# Patient Record
Sex: Female | Born: 1975 | Race: White | Hispanic: No | Marital: Married | Smoking: Current every day smoker
Health system: Southern US, Community
[De-identification: ages and names within clinical notes are randomized; demographics above are authoritative.]

## PROBLEM LIST (undated history)

## (undated) DIAGNOSIS — F419 Anxiety disorder, unspecified: Secondary | ICD-10-CM

---

## 2007-08-22 ENCOUNTER — Ambulatory Visit: Payer: Self-pay | Admitting: Internal Medicine

## 2008-01-02 ENCOUNTER — Ambulatory Visit: Payer: Self-pay | Admitting: Emergency Medicine

## 2008-01-27 ENCOUNTER — Ambulatory Visit: Payer: Self-pay | Admitting: Family Medicine

## 2008-03-21 ENCOUNTER — Ambulatory Visit: Payer: Self-pay | Admitting: Family Medicine

## 2008-05-20 ENCOUNTER — Ambulatory Visit: Payer: Self-pay | Admitting: Family Medicine

## 2008-07-09 ENCOUNTER — Ambulatory Visit: Payer: Self-pay | Admitting: Family Medicine

## 2009-02-26 ENCOUNTER — Ambulatory Visit: Payer: Self-pay | Admitting: Internal Medicine

## 2009-05-19 ENCOUNTER — Ambulatory Visit: Payer: Self-pay | Admitting: Internal Medicine

## 2009-06-27 ENCOUNTER — Ambulatory Visit: Payer: Self-pay | Admitting: Internal Medicine

## 2012-11-08 ENCOUNTER — Ambulatory Visit: Payer: Self-pay

## 2012-11-21 ENCOUNTER — Ambulatory Visit: Payer: Self-pay | Admitting: Family Medicine

## 2013-05-08 ENCOUNTER — Ambulatory Visit: Payer: Self-pay | Admitting: Family Medicine

## 2017-08-23 ENCOUNTER — Other Ambulatory Visit: Payer: Self-pay

## 2017-08-23 ENCOUNTER — Ambulatory Visit
Admission: EM | Admit: 2017-08-23 | Discharge: 2017-08-23 | Disposition: A | Discharge status: Home / Self Care | Payer: BLUE CROSS/BLUE SHIELD

## 2017-08-23 ENCOUNTER — Encounter: Payer: Self-pay | Admitting: Emergency Medicine

## 2017-08-23 DIAGNOSIS — R519 Headache, unspecified: Secondary | ICD-10-CM

## 2017-08-23 DIAGNOSIS — R51 Headache: Secondary | ICD-10-CM | POA: Diagnosis not present

## 2017-08-23 MED ORDER — SUMATRIPTAN SUCCINATE 6 MG/0.5ML ~~LOC~~ SOLN
6.0000 mg | Freq: Once | SUBCUTANEOUS | Status: AC
  Administered 2017-08-23: 6 mg via SUBCUTANEOUS

## 2017-08-23 NOTE — Discharge Instructions (Signed)
TRest. Drink plenty of fluids.   Follow up with your primary care physician. Return to Urgent care or Emergency room for new or worsening concerns.

## 2017-08-23 NOTE — ED Provider Notes (Signed)
[facility] ____________________________________________  Time seen: Approximately 8:56 AM  I have reviewed the triage vital signs and the nursing notes.   HISTORY  Chief Complaint Headache   HPI Kristen Huynh is a 42 y.o. female presenting for evaluation of headache.  Patient describes her headache as a frontal headache, stating she feels like it is behind both of her eyes.  Patient states that this is similar to previous headaches.  States current headache is described as throbbing and has been present since Saturday, but has intermittently improved and then increase in severity.  Unrelieved with over-the-counter Tylenol and ibuprofen. Reports chronic headaches and previously told that she had cluster headaches.  States never previously evaluated by neurologist.  States headaches vary in intensity, frequency and length of time.  Patient again states this is same as previous headaches.  Reports some intermittent vision changes described as hard to focus.  Denies vision loss.  States occasional accompanying nausea, denies current nausea.  Denies fall or head injury.  States current pain is moderate.  No recent sickness, cough, fevers, sore throat.  Denies facial swelling, loss of sensation, paresthesias, unilateral weakness, syncope or near syncope.  Reports continues to eat and drink well. Denies chest pain, shortness of breath, abdominal pain, dysuria, extremity pain, extremity swelling or rash. Denies recent sickness. Denies recent antibiotic use.  patient reports in the past Tylenol No. 3  And tramadol worked well for her headaches, but antimigraine medicine did not work as well.  No LMP recorded. Patient is not currently having periods (Reason: IUD).  Last menstrual this week.  Denies pregnancy.  History reviewed. No pertinent past medical history.  There are no active problems to display for this patient.   Past Surgical History:  Procedure Laterality Date    . CESAREAN SECTION       No current facility-administered medications for this encounter.   Current Outpatient Medications:  .  escitalopram (LEXAPRO) 20 MG tablet, Take 20 mg by mouth daily., Disp: , Rfl:  .  levonorgestrel (MIRENA) 20 MCG/24HR IUD, 1 each by Intrauterine route once., Disp: , Rfl:  .  LORazepam (ATIVAN) 1 MG tablet, Take 1 mg by mouth every 4 (four) hours as needed for anxiety., Disp: , Rfl:   Allergies Patient has no known allergies.  History reviewed. No pertinent family history.  Social History Social History   Tobacco Use  . Smoking status: Current Every Day Smoker    Types: Cigarettes  . Smokeless tobacco: Never Used  Substance Use Topics  . Alcohol use: No    Frequency: Never  . Drug use: Not on file    Review of Systems Constitutional: No fever/chills ENT: No sore throat. Cardiovascular: Denies chest pain. Respiratory: Denies shortness of breath. Gastrointestinal: No abdominal pain.   Musculoskeletal: Negative for back pain. Skin: Negative for rash. Neurological: Negative for  focal weakness or numbness.   ____________________________________________   PHYSICAL EXAM:  VITAL SIGNS: ED Triage Vitals  Enc Vitals Group     BP 08/23/17 0839 122/74     Pulse Rate 08/23/17 0839 (!) 103     Resp 08/23/17 0839 16     Temp 08/23/17 0839 98.6 F (37 C)     Temp Source 08/23/17 0839 Oral     SpO2 08/23/17 0839 100 %     Weight 08/23/17 0835 194 lb (88 kg)     Height 08/23/17 0835 5\' 4"  (1.626 m)     Head Circumference --  Peak Flow --      Pain Score 08/23/17 0835 10     Pain Loc --      Pain Edu? --      Excl. in GC? --    Constitutional: Alert and oriented. Well appearing and in no acute distress. Eyes: Conjunctivae are normal. PERRL. EOMI.  Head: Atraumatic. No tenderness over temporal arteries. No sinus TTP. No tenderness to palpation.   Ears: no erythema, normal TMs bilaterally.   Nose: No  congestion/rhinnorhea.  Mouth/Throat: Mucous membranes are moist.  Oropharynx non-erythematous. Neck: No stridor.  No cervical spine tenderness to palpation.  Hematological/Lymphatic/Immunilogical: No cervical lymphadenopathy. Cardiovascular: Normal rate, regular rhythm. Grossly normal heart sounds.  Good peripheral circulation. Respiratory: Normal respiratory effort.  No retractions.  No wheezes, rales or rhonchi. Musculoskeletal: No lower or upper extremity tenderness nor edema.   No cervical, thoracic or lumbar TTP.  Neurologic:  Normal speech and language. No gross focal neurologic deficits are appreciated. No gait instability. No ataxia.Negative Romberg.No meningismus.   Skin:  Skin is warm, dry and intact. No rash noted. Psychiatric: Mood and affect are normal. Speech and behavior are normal. ___________________________________________   LABS (all labs ordered are listed, but only abnormal results are displayed)  Labs Reviewed - No data to display ____________________________________________   PROCEDURES Procedures   INITIAL IMPRESSION / ASSESSMENT AND PLAN / ED COURSE  Pertinent labs & imaging results that were available during my care of the patient were reviewed by me and considered in my medical decision making (see chart for details).  Well-appearing patient.  Patient with acute on chronic headache.  Reports history of cluster headaches, and not previously evaluated by neurology.  Recommend patient to continue to work to establish primary care, reporting that she has an appointment in February.  Encouraged neurology evaluation.  6 mg Imitrex subcu given once in urgent care with some improvement.  Encourage patient to follow-up as mentioned.  Encourage rest, fluids, supportive care.  Declines nausea medicine.  Discussed strict follow-up and return parameters including proceed to the emergency room for any worsening concerns.   Discussed follow up and return parameters including  no resolution or any worsening concerns. Patient verbalized understanding and agreed to plan.   ____________________________________________   FINAL CLINICAL IMPRESSION(S) / ED DIAGNOSES  Final diagnoses:  Acute nonintractable headache, unspecified headache type     ED Discharge Orders    None       Note: This dictation was prepared with Dragon dictation along with smaller phrase technology. Any transcriptional errors that result from this process are unintentional.         Renford DillsMiller, Sally-Ann Cutbirth, NP 08/23/17 1110

## 2017-08-23 NOTE — ED Triage Notes (Signed)
Patient c/o HAs that started on Saturday.  Patient states that she has been told she has cluster headaches.  Patient in the process of finding a PCP.

## 2017-08-26 ENCOUNTER — Telehealth: Payer: Self-pay

## 2017-08-26 NOTE — Telephone Encounter (Signed)
I called and left a VMM with our return contact information

## 2018-09-02 ENCOUNTER — Ambulatory Visit
Admission: EM | Admit: 2018-09-02 | Discharge: 2018-09-02 | Disposition: A | Discharge status: Home / Self Care | Payer: BLUE CROSS/BLUE SHIELD | Attending: Family Medicine | Admitting: Family Medicine

## 2018-09-02 ENCOUNTER — Encounter: Payer: Self-pay | Admitting: Gynecology

## 2018-09-02 ENCOUNTER — Other Ambulatory Visit: Payer: Self-pay

## 2018-09-02 DIAGNOSIS — B9689 Other specified bacterial agents as the cause of diseases classified elsewhere: Secondary | ICD-10-CM | POA: Diagnosis not present

## 2018-09-02 DIAGNOSIS — N760 Acute vaginitis: Secondary | ICD-10-CM | POA: Diagnosis not present

## 2018-09-02 HISTORY — DX: Anxiety disorder, unspecified: F41.9

## 2018-09-02 LAB — WET PREP, GENITAL
Sperm: NONE SEEN
Trich, Wet Prep: NONE SEEN
Yeast Wet Prep HPF POC: NONE SEEN

## 2018-09-02 LAB — URINALYSIS, COMPLETE (UACMP) WITH MICROSCOPIC
Bilirubin Urine: NEGATIVE
GLUCOSE, UA: NEGATIVE mg/dL
Ketones, ur: NEGATIVE mg/dL
Nitrite: NEGATIVE
Protein, ur: NEGATIVE mg/dL
Specific Gravity, Urine: 1.02 (ref 1.005–1.030)
pH: 7.5 (ref 5.0–8.0)

## 2018-09-02 MED ORDER — METRONIDAZOLE 500 MG PO TABS: 500.0000 mg | ORAL_TABLET | Freq: Two times a day (BID) | ORAL | 0 refills | Status: AC

## 2018-09-02 MED ORDER — FLUCONAZOLE 150 MG PO TABS: 150.0000 mg | ORAL_TABLET | Freq: Once | ORAL | 0 refills | Status: AC

## 2018-09-02 NOTE — ED Triage Notes (Signed)
Per patient with yeast infection x 2 weeks. Per patient  Vaginal itching and burning

## 2018-09-02 NOTE — ED Provider Notes (Signed)
[facility]    CSN: [id number] Arrival date & time: 09/02/18  0803     History   Chief Complaint Chief Complaint  Patient presents with  . Vaginitis    HPI Kristen Huynh is a 43 y.o. female.   43 year old female presents with vaginal itching and irritation for the past 2 weeks. Started with slight irritation and has gotten worse in the past week. Denies any fever, dysuria, nausea or vomiting. Also denies any abdominal pain or back pain. Has tried Vagisil with some relief. Currently has Mirena IUD in place and occasionally spots. Noticed slight brown discharge today. Is sexually active with husband. Last yeast infection many years ago. Has a GYN and was planning to see them tomorrow but irritation and itching got so much worse today that she needed to be seen. Other chronic health issues includes anxiety and currently on Lexapro daily.   The history is provided by the patient.    Past Medical History:  Diagnosis Date  . Anxiety     There are no active problems to display for this patient.   Past Surgical History:  Procedure Laterality Date  . CESAREAN SECTION      OB History   No obstetric history on file.      Home Medications    Prior to Admission medications   Medication Sig Start Date End Date Taking? Authorizing Provider  escitalopram (LEXAPRO) 20 MG tablet Take 20 mg by mouth daily.   Yes [provider]  levonorgestrel (MIRENA) 20 MCG/24HR IUD 1 each by Intrauterine route once.   Yes [provider]  metroNIDAZOLE (FLAGYL) 500 MG tablet Take 1 tablet (500 mg total) by mouth 2 (two) times daily for 7 days. 09/02/18 09/09/18  Sudie GrumblingAmyot, Tank Difiore Berry, NP    Family History Family History  Problem Relation Age of Onset  . Heart disease Father     Social History Social History   Tobacco Use  . Smoking status: Current Every Day Smoker    Types: Cigarettes  . Smokeless tobacco: Never Used  Substance Use Topics  . Alcohol  use: No    Frequency: Never  . Drug use: Never     Allergies   Patient has no known allergies.   Review of Systems Review of Systems  Constitutional: Negative for activity change, appetite change, chills, fatigue and fever.  HENT: Negative for mouth sores, postnasal drip, sore throat and trouble swallowing.   Respiratory: Negative for cough, chest tightness, shortness of breath and wheezing.   Gastrointestinal: Negative for abdominal pain, constipation, diarrhea, nausea and vomiting.  Genitourinary: Positive for vaginal bleeding (slight spotting), vaginal discharge and vaginal pain (irritation and itching). Negative for decreased urine volume, difficulty urinating, dysuria, flank pain, frequency, genital sores, hematuria, pelvic pain and urgency.  Musculoskeletal: Negative for arthralgias, back pain and myalgias.  Skin: Negative for color change, rash and wound.  Allergic/Immunologic: Negative for immunocompromised state.  Neurological: Negative for dizziness, seizures, syncope, weakness, light-headedness and headaches.  Hematological: Negative for adenopathy. Does not bruise/bleed easily.     Physical Exam Triage Vital Signs ED Triage Vitals  Enc Vitals Group     BP 09/02/18 0814 (!) 122/91     Pulse Rate 09/02/18 0814 93     Resp 09/02/18 0814 16     Temp 09/02/18 0814 98 F (36.7 C)     Temp Source 09/02/18 0814 Oral     SpO2 09/02/18 0814 100 %  Weight 09/02/18 0816 186 lb (84.4 kg)     Height 09/02/18 0816 5\' 1"  (1.549 m)     Head Circumference --      Peak Flow --      Pain Score 09/02/18 0816 2     Pain Loc --      Pain Edu? --      Excl. in GC? --    No data found.  Updated Vital Signs BP (!) 122/91 (BP Location: Left Arm)   Pulse 93   Temp 98 F (36.7 C) (Oral)   Resp 16   Ht 5\' 1"  (1.549 m)   Wt 186 lb (84.4 kg)   SpO2 100%   BMI 35.14 kg/m   Visual Acuity Right Eye Distance:   Left Eye Distance:   Bilateral Distance:    Right Eye Near:     Left Eye Near:    Bilateral Near:     Physical Exam Vitals signs and nursing note reviewed.  Constitutional:      General: She is awake. She is not in acute distress.    Appearance: Normal appearance. She is well-developed, well-groomed and overweight. She is not ill-appearing.     Comments: Patient sitting comfortably on exam table in no acute distress.   HENT:     Head: Normocephalic and atraumatic.     Right Ear: External ear normal.     Left Ear: External ear normal.  Eyes:     Extraocular Movements: Extraocular movements intact.     Conjunctiva/sclera: Conjunctivae normal.  Neck:     Musculoskeletal: Normal range of motion.  Cardiovascular:     Rate and Rhythm: Normal rate.  Pulmonary:     Effort: Pulmonary effort is normal.  Abdominal:     General: Abdomen is flat. A surgical scar is present. Bowel sounds are normal. There is no distension.     Palpations: Abdomen is soft. There is no mass.     Tenderness: There is no abdominal tenderness. There is no right CVA tenderness, left CVA tenderness, guarding or rebound.       Comments: C- section scar between lower folds of skin with white clumpy discharge present on erythematous base. Appears moist. Non-tender.   Genitourinary:    Comments: Patient declined pelvic exam- obtained vaginal self swab for testing.  Musculoskeletal: Normal range of motion.  Skin:    General: Skin is warm and dry.     Capillary Refill: Capillary refill takes less than 2 seconds.     Findings: Erythema and rash present. No abscess, bruising or ecchymosis.  Neurological:     General: No focal deficit present.     Mental Status: She is alert and oriented to person, place, and time.  Psychiatric:        Mood and Affect: Mood normal.        Behavior: Behavior normal. Behavior is cooperative.        Thought Content: Thought content normal.        Judgment: Judgment normal.      UC Treatments / Results  Labs (all labs ordered are listed, but  only abnormal results are displayed) Labs Reviewed  WET PREP, GENITAL - Abnormal; Notable for the following components:      Result Value   Clue Cells Wet Prep HPF POC PRESENT (*)    WBC, Wet Prep HPF POC FEW (*)    All other components within normal limits  URINALYSIS, COMPLETE (UACMP) WITH MICROSCOPIC - Abnormal; Notable for the  following components:   APPearance CLOUDY (*)    Hgb urine dipstick MODERATE (*)    Leukocytes, UA TRACE (*)    Bacteria, UA FEW (*)    All other components within normal limits    EKG None  Radiology No results found.  Procedures Procedures (including critical care time)  Medications Ordered in UC Medications - No data to display  Initial Impression / Assessment and Plan / UC Course  I have reviewed the triage vital signs and the nursing notes.  Pertinent labs & imaging results that were available during my care of the patient were reviewed by me and considered in my medical decision making (see chart for details).    Reviewed wet prep and urinalysis results- positive for clue cells- probable BV. Also discussed blood in urine may be from vaginal spotting. Doubt UTI at this time since no distinct urinary symptoms. Discussed that she appears to have a mild fungal skin infection along C-sect scar. Recommend start Flagyl 500mg  twice a day for BV. Take Diflucan 150mg  once now and repeat 1 tablet in 5 days. May continue Vagisil as needed for now for comfort. May also soak in oatmeal baths for comfort. Discussed using anti-fungal powder (such as Lotrimin or Tinactin) in C-sect scar area to help resolve and prevent recurrent skin fungal infections. Try to keep area clean and dry. Recommend follow-up with her GYN in 3 to 4 days if not improving.   Final Clinical Impressions(s) / UC Diagnoses   Final diagnoses:  Bacterial vaginitis  Vaginitis and vulvovaginitis     Discharge Instructions     Recommend start Flagyl 500mg  twice a day as directed- no  alcohol. May take Diflucan 150mg  once today and repeat 1 tablet in 5 days if needed. May continue Vagisil for now for comfort. May also soak in oatmeal baths for comfort. Follow-up with your GYN in 3 to 4 days if not improving.     ED Prescriptions    Medication Sig Dispense Auth. Provider   metroNIDAZOLE (FLAGYL) 500 MG tablet Take 1 tablet (500 mg total) by mouth 2 (two) times daily for 7 days. 14 tablet Sudie GrumblingAmyot, Rhema Boyett Berry, NP   fluconazole (DIFLUCAN) 150 MG tablet Take 1 tablet (150 mg total) by mouth once for 1 dose. May repeat 1 tablet in 5 days. 2 tablet Sudie GrumblingAmyot, Lidya Mccalister Berry, NP     Controlled Substance Prescriptions [place] Controlled Substance Registry consulted? Not Applicable   Sudie Grumblingmyot, Ulises Wolfinger Berry, NP 09/03/18 1010

## 2018-09-02 NOTE — Discharge Instructions (Addendum)
Recommend start Flagyl 500mg  twice a day as directed- no alcohol. May take Diflucan 150mg  once today and repeat 1 tablet in 5 days if needed. May continue Vagisil for now for comfort. May also soak in oatmeal baths for comfort. Follow-up with your GYN in 3 to 4 days if not improving.

## 2019-03-27 ENCOUNTER — Encounter: Payer: Self-pay | Admitting: Emergency Medicine

## 2019-03-27 ENCOUNTER — Ambulatory Visit (INDEPENDENT_AMBULATORY_CARE_PROVIDER_SITE_OTHER): Payer: Self-pay

## 2019-03-27 ENCOUNTER — Other Ambulatory Visit: Payer: Self-pay

## 2019-03-27 ENCOUNTER — Ambulatory Visit
Admission: EM | Admit: 2019-03-27 | Discharge: 2019-03-27 | Disposition: A | Discharge status: Home / Self Care | Payer: Self-pay | Attending: Family Medicine | Admitting: Family Medicine

## 2019-03-27 DIAGNOSIS — M25562 Pain in left knee: Secondary | ICD-10-CM

## 2019-03-27 LAB — CBC WITH DIFFERENTIAL/PLATELET
Abs Immature Granulocytes: 0.03 10*3/uL (ref 0.00–0.07)
Basophils Absolute: 0.1 10*3/uL (ref 0.0–0.1)
Basophils Relative: 1 %
Eosinophils Absolute: 0.3 10*3/uL (ref 0.0–0.5)
Eosinophils Relative: 3 %
HCT: 41.9 % (ref 36.0–46.0)
Hemoglobin: 13.7 g/dL (ref 12.0–15.0)
Immature Granulocytes: 0 %
Lymphocytes Relative: 40 %
Lymphs Abs: 3.7 10*3/uL (ref 0.7–4.0)
MCH: 27.5 pg (ref 26.0–34.0)
MCHC: 32.7 g/dL (ref 30.0–36.0)
MCV: 84 fL (ref 80.0–100.0)
Monocytes Absolute: 0.6 10*3/uL (ref 0.1–1.0)
Monocytes Relative: 7 %
Neutro Abs: 4.4 10*3/uL (ref 1.7–7.7)
Neutrophils Relative %: 49 %
Platelets: 293 10*3/uL (ref 150–400)
RBC: 4.99 MIL/uL (ref 3.87–5.11)
RDW: 14.4 % (ref 11.5–15.5)
WBC: 9 10*3/uL (ref 4.0–10.5)
nRBC: 0 % (ref 0.0–0.2)

## 2019-03-27 LAB — URIC ACID: Uric Acid, Serum: 4.5 mg/dL (ref 2.5–7.1)

## 2019-03-27 MED ORDER — PREDNISONE 20 MG PO TABS: ORAL_TABLET | ORAL | 0 refills | Status: DC

## 2019-03-27 MED ORDER — HYDROCODONE-ACETAMINOPHEN 5-325 MG PO TABS: ORAL_TABLET | ORAL | 0 refills | Status: DC

## 2019-03-27 MED ORDER — KETOROLAC TROMETHAMINE 60 MG/2ML IM SOLN
60.0000 mg | Freq: Once | INTRAMUSCULAR | Status: AC
  Administered 2019-03-27: 11:00:00 60 mg via INTRAMUSCULAR

## 2019-03-27 NOTE — ED Triage Notes (Signed)
Pt c/o left knee pain. She states that she woke up with it this morning. No known injury. She states that sometimes it hurts but nothing like it does today.

## 2019-03-27 NOTE — ED Provider Notes (Signed)
[facility]    CSN: [id number] Arrival date & time: 03/27/19  1024      History   Chief Complaint Chief Complaint  Patient presents with   Knee Pain    left    HPI Kristen Huynh is a 43 y.o. female.   43 yo female with a c/o left knee pain since this morning. Denies any falls or other traumatic injury. States her knee occasionally hurts but has not hurt like this in the past. Denies any fevers, chills, redness. States she notice some slight swelling this morning as well.    Knee Pain   Past Medical History:  Diagnosis Date   Anxiety     There are no active problems to display for this patient.   Past Surgical History:  Procedure Laterality Date   CESAREAN SECTION      OB History   No obstetric history on file.      Home Medications    Prior to Admission medications   Medication Sig Start Date End Date Taking? Authorizing Provider  escitalopram (LEXAPRO) 20 MG tablet Take 20 mg by mouth daily.   Yes [provider]  levonorgestrel (MIRENA) 20 MCG/24HR IUD 1 each by Intrauterine route once.   Yes [provider]  HYDROcodone-acetaminophen (NORCO/VICODIN) 5-325 MG tablet 1-2 tabs po bid prn 03/27/19   Payton Mccallumonty, Khari Lett, MD  predniSONE (DELTASONE) 20 MG tablet 3 tabs po qd x 2 days, then 2 tabs po qd x 3 days, then 1 tab po qd x 3 days, then half a tab po qd x 2 days 03/27/19   Payton Mccallumonty, Lachina Salsberry, MD    Family History Family History  Problem Relation Age of Onset   Heart disease Father     Social History Social History   Tobacco Use   Smoking status: Current Every Day Smoker    Types: Cigarettes   Smokeless tobacco: Never Used  Substance Use Topics   Alcohol use: No    Frequency: Never   Drug use: Never     Allergies   Patient has no known allergies.   Review of Systems Review of Systems   Physical Exam Triage Vital Signs ED Triage Vitals  Enc Vitals Group     BP 03/27/19 1052 (!) 136/91   Pulse Rate 03/27/19 1052 84     Resp 03/27/19 1052 18     Temp 03/27/19 1052 98.3 F (36.8 C)     Temp Source 03/27/19 1052 Oral     SpO2 03/27/19 1052 98 %     Weight 03/27/19 1050 192 lb (87.1 kg)     Height 03/27/19 1050 5\' 3"  (1.6 m)     Head Circumference --      Peak Flow --      Pain Score 03/27/19 1049 10     Pain Loc --      Pain Edu? --      Excl. in GC? --    No data found.  Updated Vital Signs BP (!) 136/91 (BP Location: Left Arm)    Pulse 84    Temp 98.3 F (36.8 C) (Oral)    Resp 18    Ht 5\' 3"  (1.6 m)    Wt 87.1 kg    SpO2 98%    BMI 34.01 kg/m   Visual Acuity Right Eye Distance:   Left Eye Distance:   Bilateral Distance:    Right Eye Near:   Left Eye Near:    Bilateral Near:  Physical Exam Vitals signs and nursing note reviewed.  Constitutional:      General: She is not in acute distress.    Appearance: She is not toxic-appearing or diaphoretic.  Musculoskeletal:     Left knee: She exhibits swelling (mild). She exhibits normal range of motion, no effusion, no ecchymosis, no deformity, no laceration, no erythema, normal alignment, no LCL laxity, normal patellar mobility, no bony tenderness, normal meniscus and no MCL laxity. Tenderness (anterior knee) found. Patellar tendon tenderness noted. No medial joint line, no lateral joint line, no MCL and no LCL tenderness noted.  Neurological:     Mental Status: She is alert.      UC Treatments / Results  Labs (all labs ordered are listed, but only abnormal results are displayed) Labs Reviewed  CBC WITH DIFFERENTIAL/PLATELET  URIC ACID    EKG   Radiology Dg Knee Complete 4 Views Left  Result Date: 03/27/2019 CLINICAL DATA:  Pain and swelling EXAM: LEFT KNEE - COMPLETE 4+ VIEW COMPARISON:  None. FINDINGS: Upright frontal, upright tunnel, supine lateral, and sunrise patellar images were obtained. There is no fracture or dislocation. No evident joint effusion. There is no appreciable joint space  narrowing. There is slight spurring in all compartments. No erosive change. IMPRESSION: Slight spurring in all compartments without appreciable joint space narrowing. No fracture or dislocation. No evident joint effusion. Electronically Signed   By: Lowella Grip III M.D.   On: 03/27/2019 11:40    Procedures Procedures (including critical care time)  Medications Ordered in UC Medications  ketorolac (TORADOL) injection 60 mg (60 mg Intramuscular Given 03/27/19 1116)    Initial Impression / Assessment and Plan / UC Course  I have reviewed the triage vital signs and the nursing notes.  Pertinent labs & imaging results that were available during my care of the patient were reviewed by me and considered in my medical decision making (see chart for details).      Final Clinical Impressions(s) / UC Diagnoses   Final diagnoses:  Acute pain of left knee    ED Prescriptions    Medication Sig Dispense Auth. Provider   predniSONE (DELTASONE) 20 MG tablet 3 tabs po qd x 2 days, then 2 tabs po qd x 3 days, then 1 tab po qd x 3 days, then half a tab po qd x 2 days 16 tablet Omarion Minnehan, Linward Foster, MD   HYDROcodone-acetaminophen (NORCO/VICODIN) 5-325 MG tablet 1-2 tabs po bid prn 6 tablet Norval Gable, MD      1. x-ray results and differential diagnosis reviewed with patient; given toradol 60mg  IM x 1 in clinic with improvement of symptoms 2. rx as per orders above; reviewed possible side effects, interactions, risks and benefits  3. Recommend supportive treatment with rest, ice, elevation 4. Follow-up prn if symptoms worsen or don't improve   Controlled Substance Prescriptions  Controlled Substance Registry consulted? Not Applicable   Norval Gable, MD 03/27/19 [phone]

## 2020-11-23 IMAGING — CR LEFT KNEE - COMPLETE 4+ VIEW
4 series · 4 of 4 positions shown · non-contrast
Comparison: None.

CLINICAL DATA: Pain and swelling

EXAM:
LEFT KNEE - COMPLETE 4+ VIEW

[knee ap]
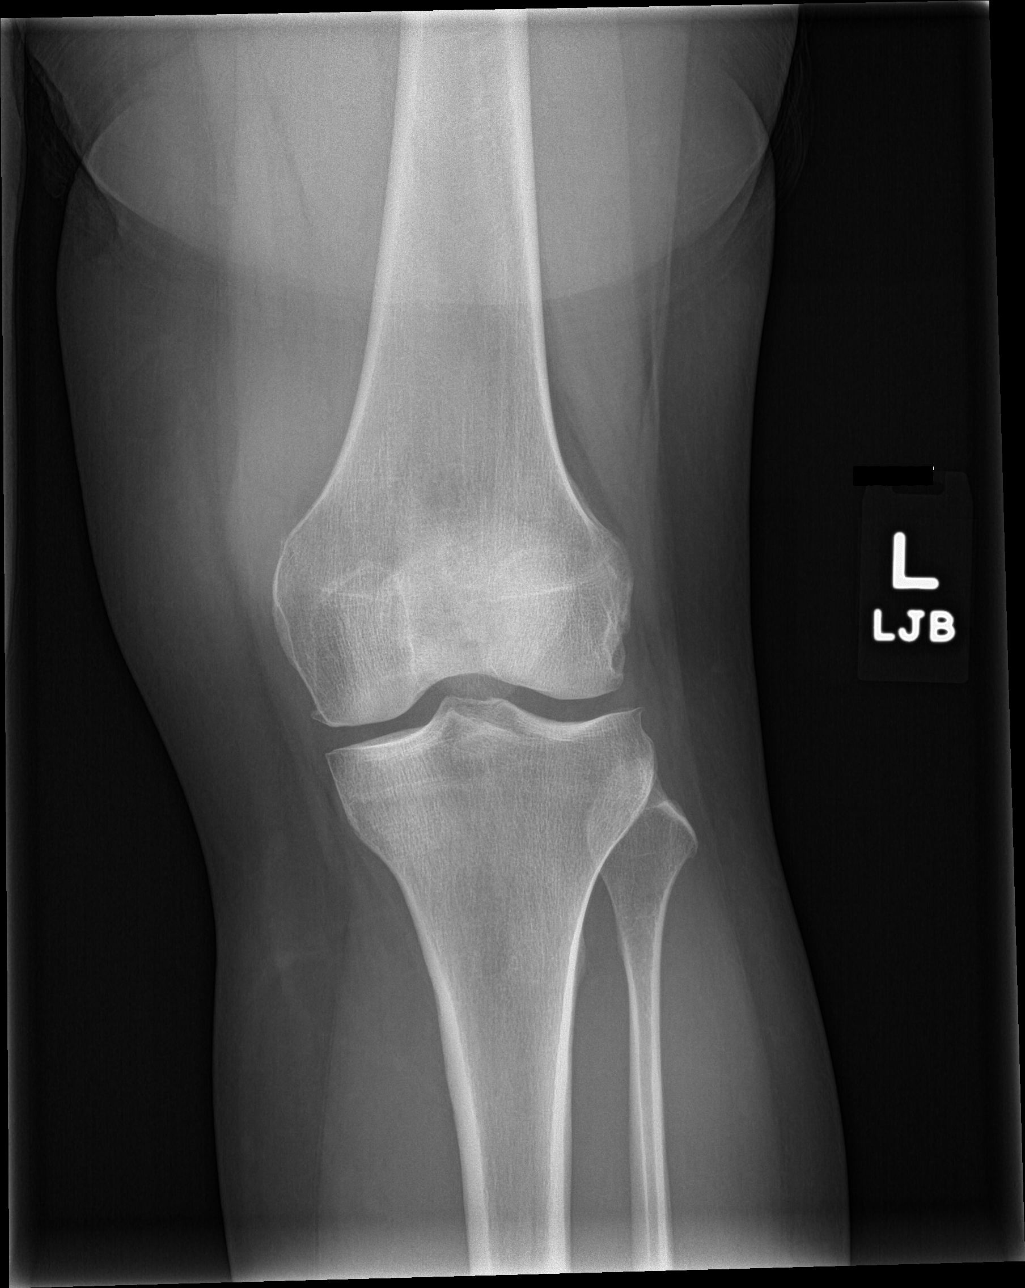

[knee lat]
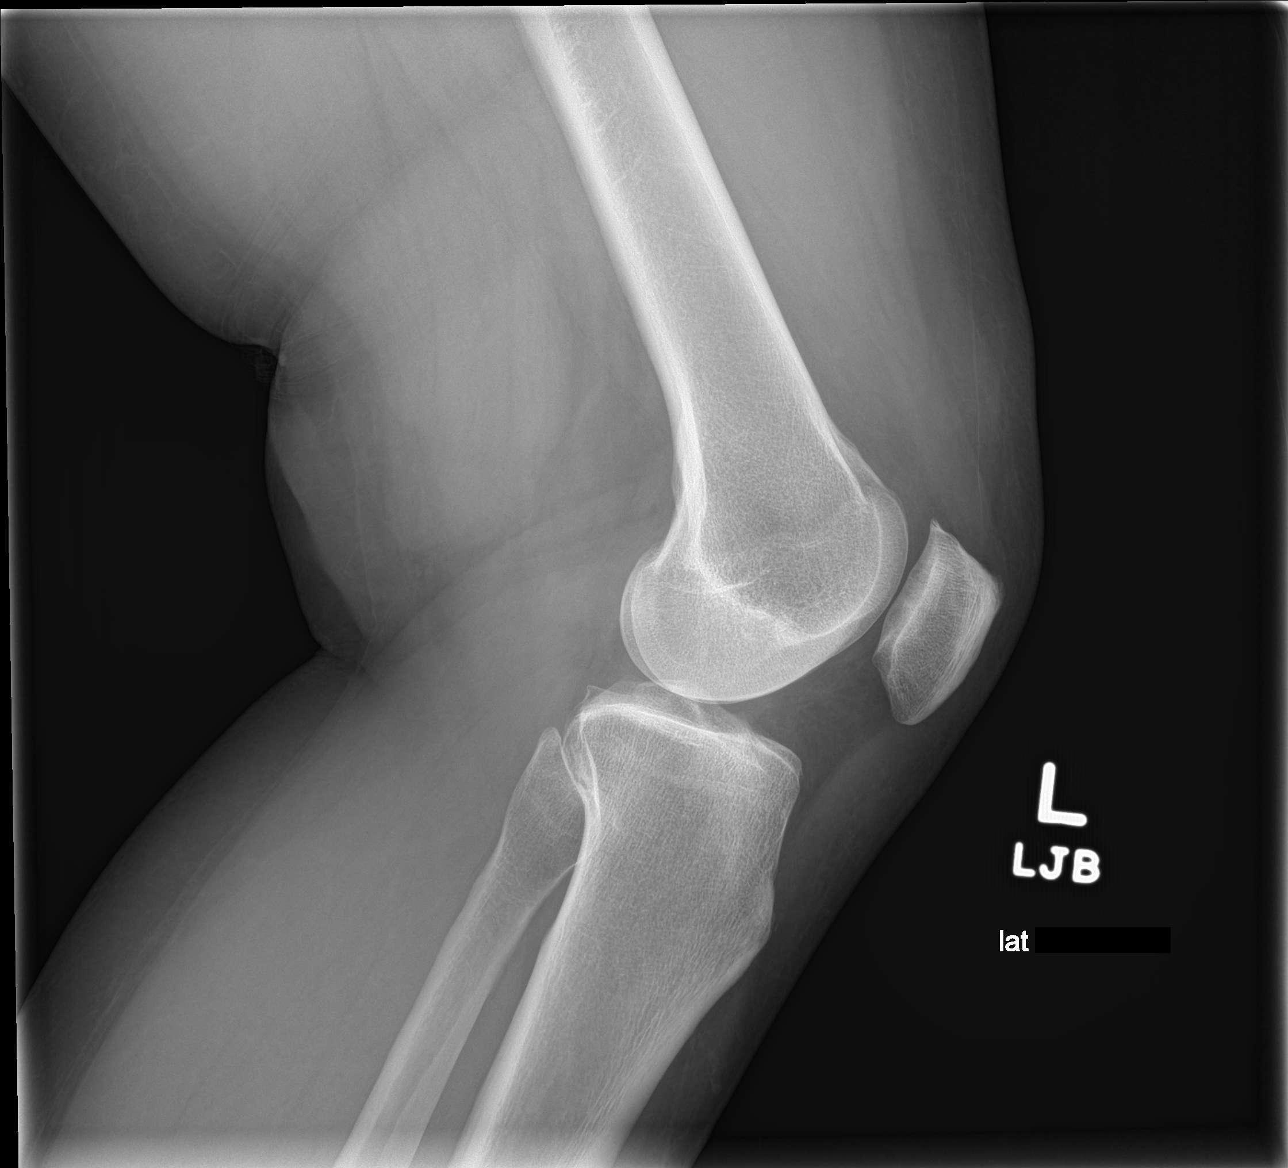

[tunnel]
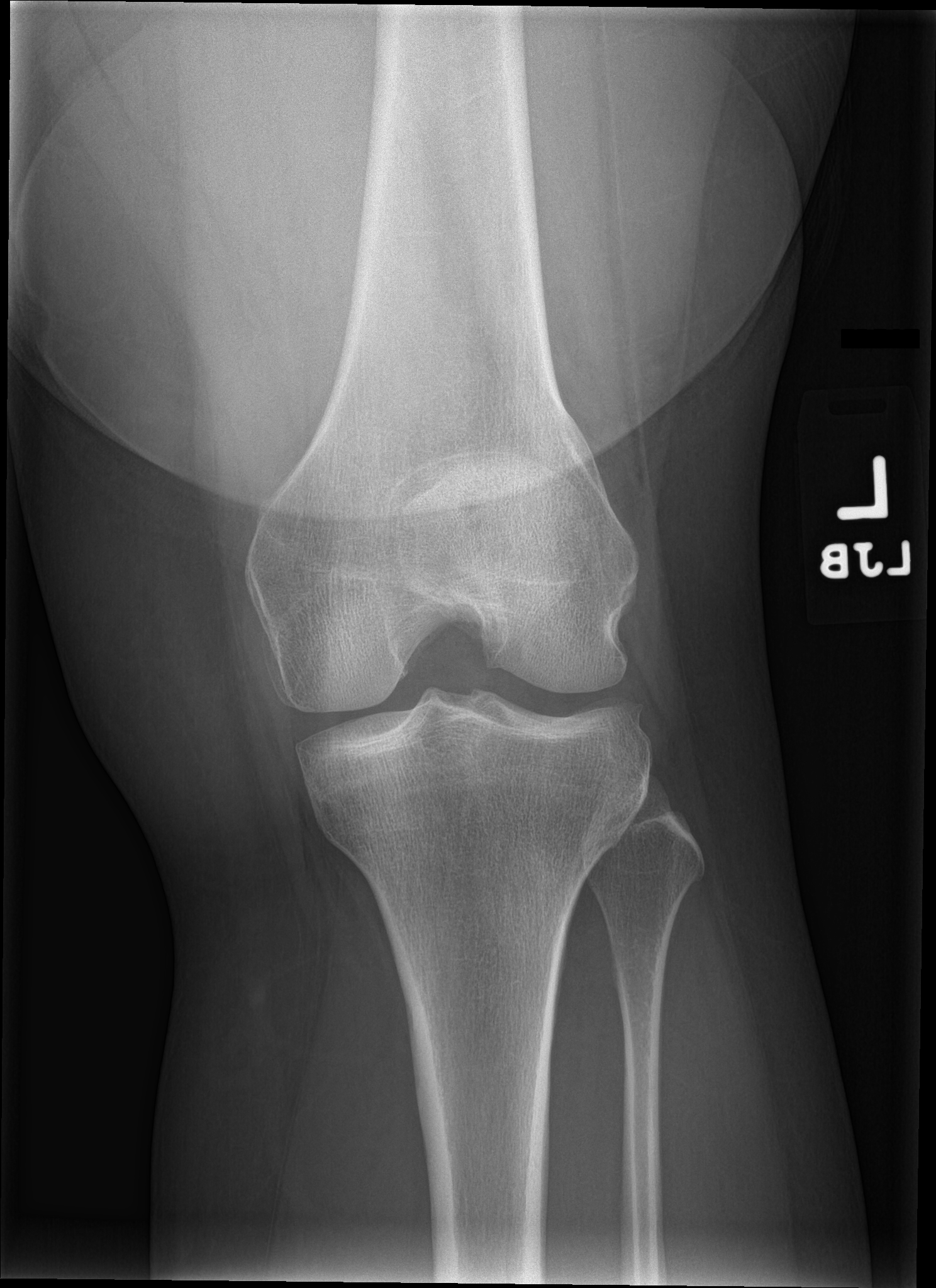

[patella skyline]
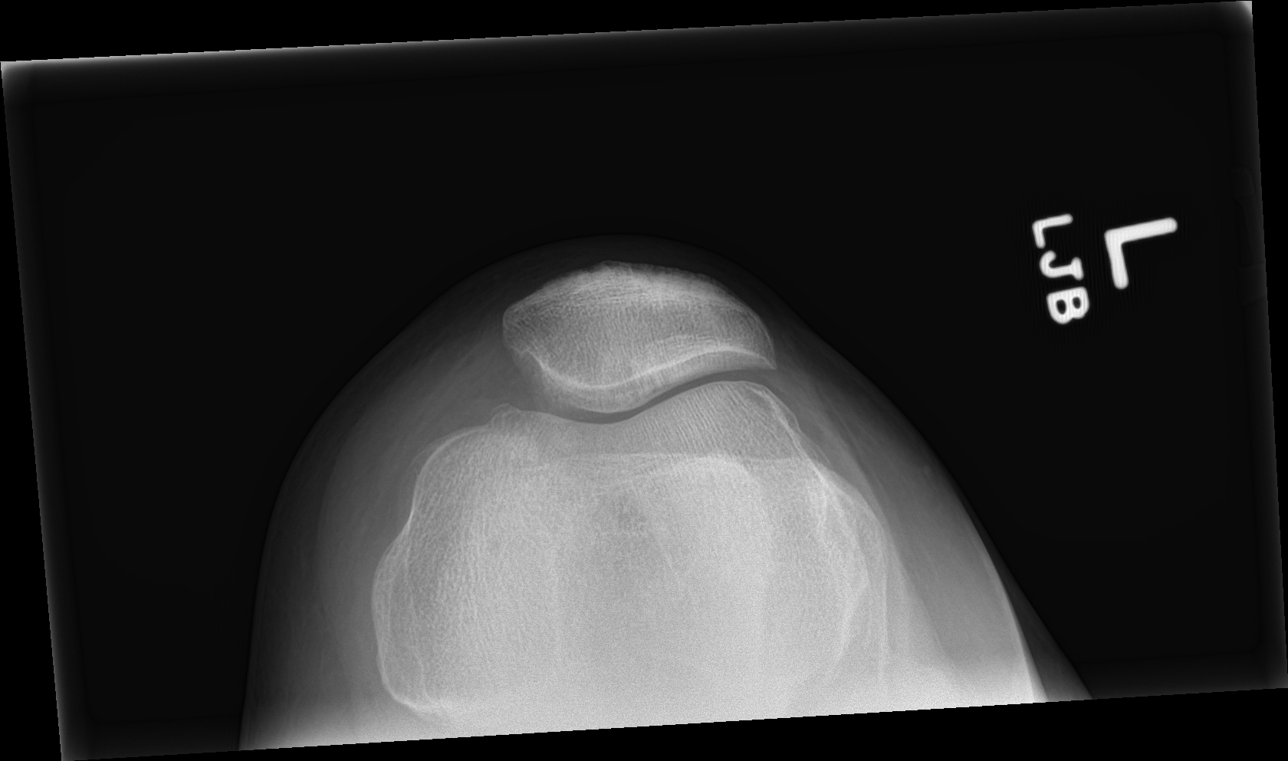

[4 of 4 positions shown; findings below may reference images not displayed]

FINDINGS: Upright frontal, upright tunnel, supine lateral, and sunrise
patellar images were obtained. There is no fracture or dislocation.
No evident joint effusion. There is no appreciable joint space
narrowing. There is slight spurring in all compartments. No erosive
change.
IMPRESSION: Slight spurring in all compartments without appreciable joint space
narrowing. No fracture or dislocation. No evident joint effusion.

## 2021-03-12 ENCOUNTER — Encounter: Payer: Self-pay | Admitting: Emergency Medicine

## 2021-03-12 ENCOUNTER — Other Ambulatory Visit: Payer: Self-pay

## 2021-03-12 ENCOUNTER — Ambulatory Visit
Admission: EM | Admit: 2021-03-12 | Discharge: 2021-03-12 | Disposition: A | Discharge status: Home / Self Care | Payer: BLUE CROSS/BLUE SHIELD | Attending: Emergency Medicine | Admitting: Emergency Medicine

## 2021-03-12 DIAGNOSIS — J029 Acute pharyngitis, unspecified: Secondary | ICD-10-CM | POA: Insufficient documentation

## 2021-03-12 LAB — GROUP A STREP BY PCR: Group A Strep by PCR: NOT DETECTED

## 2021-03-12 MED ORDER — LIDOCAINE VISCOUS HCL 2 % MT SOLN: 15.0000 mL | OROMUCOSAL | 0 refills | Status: AC | PRN

## 2021-03-12 MED ORDER — AZITHROMYCIN 250 MG PO TABS: 250.0000 mg | ORAL_TABLET | Freq: Every day | ORAL | 0 refills | Status: AC

## 2021-03-12 NOTE — ED Triage Notes (Signed)
Pt is present today with a sore throat that started Monday.

## 2021-03-12 NOTE — ED Provider Notes (Signed)
[facility]    CSN: [id number] Arrival date & time: 03/12/21  1503      History   Chief Complaint Chief Complaint  Patient presents with   Sore Throat    HPI Kristen Huynh is a 45 y.o. female.   HPI  45 year old female here for evaluation of sore throat.  Patient reports that she has been having a sore throat for the last 4 days.  She endorses it being painful to swallow or talk.  She denies any fever, runny nose nasal congestion, ear pain, shortness breath or wheezing, nausea, vomiting, or diarrhea.  She has had an intermittent nonproductive cough.  She reports that her son also has a sore throat.  Past Medical History:  Diagnosis Date   Anxiety     There are no problems to display for this patient.   Past Surgical History:  Procedure Laterality Date   CESAREAN SECTION      OB History   No obstetric history on file.      Home Medications    Prior to Admission medications   Medication Sig Start Date End Date Taking? Authorizing Provider  azithromycin (ZITHROMAX Z-PAK) 250 MG tablet Take 1 tablet (250 mg total) by mouth daily. Take 2 tablets on the first day and then 1 tablet daily thereafter for a total of 5 days of treatment. 03/12/21  Yes Becky Augusta, NP  lidocaine (XYLOCAINE) 2 % solution Use as directed 15 mLs in the mouth or throat as needed for mouth pain. 03/12/21  Yes Becky Augusta, NP  FLUoxetine (PROZAC) 20 MG capsule Take 60 mg by mouth daily. 02/07/21   [provider]  levonorgestrel (MIRENA) 20 MCG/24HR IUD 1 each by Intrauterine route once.    [provider]  topiramate (TOPAMAX) 25 MG tablet Take 50 mg by mouth daily. 01/27/21   [provider]    Family History Family History  Problem Relation Age of Onset   Heart disease Father     Social History Social History   Tobacco Use   Smoking status: Every Day    Types: Cigarettes   Smokeless tobacco: Never  Vaping Use   Vaping Use: Never  used  Substance Use Topics   Alcohol use: No   Drug use: Never     Allergies   Patient has no known allergies.   Review of Systems Review of Systems  Constitutional:  Negative for fever.  HENT:  Positive for sore throat. Negative for congestion, ear pain and rhinorrhea.   Respiratory:  Positive for cough. Negative for shortness of breath and wheezing.   Cardiovascular:  Negative for chest pain.  Gastrointestinal:  Negative for diarrhea, nausea and vomiting.  Skin:  Negative for rash.  Hematological: Negative.   Psychiatric/Behavioral: Negative.      Physical Exam Triage Vital Signs ED Triage Vitals [03/12/21 1519]  Enc Vitals Group     BP (!) 142/96     Pulse Rate (!) 106     Resp 18     Temp 98.6 F (37 C)     Temp Source Oral     SpO2 96 %     Weight      Height      Head Circumference      Peak Flow      Pain Score 10     Pain Loc      Pain Edu?      Excl. in GC?    No data found.  Updated Vital Signs BP (!) 142/96   Pulse (!) 106   Temp 98.6 F (37 C) (Oral)   Resp 18   SpO2 96%   Visual Acuity Right Eye Distance:   Left Eye Distance:   Bilateral Distance:    Right Eye Near:   Left Eye Near:    Bilateral Near:     Physical Exam Vitals and nursing note reviewed.  Constitutional:      General: She is not in acute distress.    Appearance: Normal appearance. She is ill-appearing.  HENT:     Head: Normocephalic and atraumatic.     Right Ear: Tympanic membrane, ear canal and external ear normal.     Left Ear: Tympanic membrane, ear canal and external ear normal.     Nose: Nose normal.     Mouth/Throat:     Mouth: Mucous membranes are moist.     Pharynx: Oropharynx is clear. Posterior oropharyngeal erythema present.  Cardiovascular:     Rate and Rhythm: Normal rate and regular rhythm.     Pulses: Normal pulses.     Heart sounds: Normal heart sounds. No murmur heard.   No gallop.  Pulmonary:     Effort: Pulmonary effort is normal.      Breath sounds: Normal breath sounds. No wheezing, rhonchi or rales.  Musculoskeletal:     Cervical back: Normal range of motion and neck supple.  Lymphadenopathy:     Cervical: Cervical adenopathy present.  Skin:    General: Skin is warm and dry.     Capillary Refill: Capillary refill takes less than 2 seconds.     Findings: No erythema or rash.  Neurological:     General: No focal deficit present.     Mental Status: She is alert and oriented to person, place, and time.  Psychiatric:        Mood and Affect: Mood normal.        Behavior: Behavior normal.        Thought Content: Thought content normal.        Judgment: Judgment normal.     UC Treatments / Results  Labs (all labs ordered are listed, but only abnormal results are displayed) Labs Reviewed  GROUP A STREP BY PCR    EKG   Radiology No results found.  Procedures Procedures (including critical care time)  Medications Ordered in UC Medications - No data to display  Initial Impression / Assessment and Plan / UC Course  I have reviewed the triage vital signs and the nursing notes.  Pertinent labs & imaging results that were available during my care of the patient were reviewed by me and considered in my medical decision making (see chart for details).  Patient is a pleasant though ill-appearing 45 year old female here for evaluation of sore throat that is been going on for the past 4 days.  Her only associated symptom is an intermittent nonproductive cough.  She has had no other upper respiratory or lower respiratory symptoms.  Patient's physical exam reveals pearly gray tympanic membranes bilaterally with normal light reflex and clear external auditory canal.  Nasal mucosa is pink and moist.  Oropharyngeal exam reveals marked erythema of the soft palate, uvula and, and bilateral tonsillar pillars.  No exudate appreciated.  Patient does have bilateral anterior cervical lymphadenopathy.  Cardiopulmonary exam reveals  clear lung sounds in all fields.  We will send strep PCR.  History PCR is negative.  Will treat patient for pharyngitis with salt water gargles,  over-the-counter NSAIDs, and Sucrets lozenges.  So suspect patient's pharyngitis is bacterial in nature and will treat with Z-Pak.  We will also prescribe viscous lidocaine for pain relief.   Final Clinical Impressions(s) / UC Diagnoses   Final diagnoses:  Pharyngitis, unspecified etiology     Discharge Instructions      Take the azithromycin daily as directed for treatment of your sore throat.  Gargle with warm salt water 2-3 times a day to soothe your throat, aid in pain relief, and aid in healing.  Take over-the-counter ibuprofen according to the package instructions as needed for pain.  You can also use Chloraseptic or Sucrets lozenges, 1 lozenge every 2 hours as needed for throat pain.  You can also take 15 mL of viscous lidocaine 10 minutes before meals to help improve your pain so that she can eat and drink.  If you develop any new or worsening symptoms return for reevaluation.      ED Prescriptions     Medication Sig Dispense Auth. Provider   azithromycin (ZITHROMAX Z-PAK) 250 MG tablet Take 1 tablet (250 mg total) by mouth daily. Take 2 tablets on the first day and then 1 tablet daily thereafter for a total of 5 days of treatment. 6 tablet Becky Augusta, NP   lidocaine (XYLOCAINE) 2 % solution Use as directed 15 mLs in the mouth or throat as needed for mouth pain. 100 mL Becky Augusta, NP      PDMP not reviewed this encounter.   Becky Augusta, NP 03/12/21 [phone]

## 2021-03-12 NOTE — Discharge Instructions (Addendum)
Take the azithromycin daily as directed for treatment of your sore throat.  Gargle with warm salt water 2-3 times a day to soothe your throat, aid in pain relief, and aid in healing.  Take over-the-counter ibuprofen according to the package instructions as needed for pain.  You can also use Chloraseptic or Sucrets lozenges, 1 lozenge every 2 hours as needed for throat pain.  You can also take 15 mL of viscous lidocaine 10 minutes before meals to help improve your pain so that she can eat and drink.  If you develop any new or worsening symptoms return for reevaluation.
